# Patient Record
Sex: Female | Born: 1980 | Race: White | Hispanic: No | Marital: Married | Smoking: Never smoker
Health system: Southern US, Community
[De-identification: ages and names within clinical notes are randomized; demographics above are authoritative.]

## PROBLEM LIST (undated history)

## (undated) DIAGNOSIS — Z8619 Personal history of other infectious and parasitic diseases: Secondary | ICD-10-CM

## (undated) DIAGNOSIS — F32A Depression, unspecified: Secondary | ICD-10-CM

## (undated) DIAGNOSIS — N200 Calculus of kidney: Secondary | ICD-10-CM

## (undated) DIAGNOSIS — T7840XA Allergy, unspecified, initial encounter: Secondary | ICD-10-CM

## (undated) HISTORY — DX: Allergy, unspecified, initial encounter: T78.40XA

## (undated) HISTORY — DX: Personal history of other infectious and parasitic diseases: Z86.19

## (undated) HISTORY — PX: NO PAST SURGERIES: SHX2092

## (undated) HISTORY — DX: Depression, unspecified: F32.A

---

## 2011-03-31 ENCOUNTER — Other Ambulatory Visit: Payer: Self-pay | Admitting: Obstetrics and Gynecology

## 2011-03-31 DIAGNOSIS — N6009 Solitary cyst of unspecified breast: Secondary | ICD-10-CM

## 2011-04-04 ENCOUNTER — Ambulatory Visit
Admission: RE | Admit: 2011-04-04 | Discharge: 2011-04-04 | Disposition: A | Discharge status: Home / Self Care | Payer: 59 | Source: Ambulatory Visit | Attending: Obstetrics and Gynecology | Admitting: Obstetrics and Gynecology

## 2011-04-04 DIAGNOSIS — N6009 Solitary cyst of unspecified breast: Secondary | ICD-10-CM

## 2012-03-27 ENCOUNTER — Emergency Department (HOSPITAL_COMMUNITY)
Admission: EM | Admit: 2012-03-27 | Discharge: 2012-03-27 | Disposition: A | Discharge status: Home / Self Care | Payer: 59 | Attending: Emergency Medicine | Admitting: Emergency Medicine

## 2012-03-27 ENCOUNTER — Encounter (HOSPITAL_COMMUNITY): Payer: Self-pay | Admitting: Emergency Medicine

## 2012-03-27 DIAGNOSIS — Y9289 Other specified places as the place of occurrence of the external cause: Secondary | ICD-10-CM | POA: Insufficient documentation

## 2012-03-27 DIAGNOSIS — W230XXA Caught, crushed, jammed, or pinched between moving objects, initial encounter: Secondary | ICD-10-CM | POA: Insufficient documentation

## 2012-03-27 DIAGNOSIS — Z87442 Personal history of urinary calculi: Secondary | ICD-10-CM | POA: Insufficient documentation

## 2012-03-27 DIAGNOSIS — S92501A Displaced unspecified fracture of right lesser toe(s), initial encounter for closed fracture: Secondary | ICD-10-CM

## 2012-03-27 DIAGNOSIS — S92919A Unspecified fracture of unspecified toe(s), initial encounter for closed fracture: Secondary | ICD-10-CM | POA: Insufficient documentation

## 2012-03-27 DIAGNOSIS — Y939 Activity, unspecified: Secondary | ICD-10-CM | POA: Insufficient documentation

## 2012-03-27 HISTORY — DX: Calculus of kidney: N20.0

## 2012-03-27 MED ORDER — IBUPROFEN 400 MG PO TABS
400.0000 mg | ORAL_TABLET | Freq: Once | ORAL | Status: AC
  Administered 2012-03-27: 400 mg via ORAL
  Filled 2012-03-27: qty 1

## 2012-03-27 NOTE — Progress Notes (Signed)
Orthopedic Tech Progress Note Patient Details:  Colleen Gillespie 1980-11-27 [id number]  Ortho Devices Type of Ortho Device: Buddy tape;Postop shoe/boot Ortho Device/Splint Location: (R) LE Ortho Device/Splint Interventions: Application;Ordered   [name] 03/27/2012, 4:34 PM

## 2012-03-27 NOTE — ED Provider Notes (Signed)
History     CSN: [id number]  Arrival date & time 03/27/12  1458   First MD Initiated Contact with Patient 03/27/12 1548      Chief Complaint  Patient presents with  . Toe Injury    (Consider location/radiation/quality/duration/timing/severity/associated sxs/prior treatment) HPI Patient injured his right fifth on a door jam at 2:30 PM today complains of pain at right fifth toe no other injury. No treatment prior to coming here pain is worse with walking or pressure. Pain is mild at present, nonradiating Past Medical History  Diagnosis Date  . Kidney stones     History reviewed. No pertinent past surgical history.  History reviewed. No pertinent family history.  History  Substance Use Topics  . Smoking status: Never Smoker   . Smokeless tobacco: Not on file  . Alcohol Use: Yes     Comment: socially    OB History   Grav Para Term Preterm Abortions TAB SAB Ect Mult Living                  Review of Systems  Constitutional: Negative.   Musculoskeletal: Positive for arthralgias.       Pain at right fifth toe    Allergies  Aspirin  Home Medications   Current Outpatient Rx  Name  Route  Sig  Dispense  Refill  . CALCIUM-VITAMIN D PO   Oral   Take 1 tablet by mouth daily.         . Multiple Vitamin (MULTIVITAMIN WITH MINERALS) TABS   Oral   Take 1 tablet by mouth daily.           BP 103/48  Pulse 65  Temp(Src) 97.6 F (36.4 C) (Oral)  SpO2 100%  LMP 03/22/2012  Physical Exam  Nursing note and vitals reviewed. Constitutional: She appears well-developed and well-nourished. No distress.  HENT:  Head: Normocephalic and atraumatic.  Eyes: EOM are normal.  Neck: Neck supple.  Cardiovascular: Normal rate.   Pulmonary/Chest: No respiratory distress.  Abdominal: She exhibits no distension.  Musculoskeletal:  Right foot fifth toe with slight valgus deformity. Skin intact, minimally tender. No subungual hematoma.    ED Course  Procedures (including  critical care time)  Labs Reviewed - No data to display No results found.   No diagnosis found.  Placed in postop shoe and toes buddy taped. Patient comfortable on discharge  MDM  I discussed with patient that she clinically has a broken toe. X-ray not needed. Plan Buddy tape. Postop shoe. Referral to [facility] as needed. Ibuprofen for pain Patient reports that she's taken ibuprofen in the past without problems. Declined other pain medicine.  Diagnosis closed fracture right fifth toe      Doug Sou, MD 03/27/12 [phone]

## 2012-03-27 NOTE — ED Notes (Signed)
Per patient she "jammed" her "pinky toe" on the right foot into the bathroom door. No bleeding noted. Toe is deformed. No other complaints at this time.

## 2012-03-27 NOTE — ED Notes (Signed)
Ortho called for post op boot. 

## 2014-07-24 ENCOUNTER — Other Ambulatory Visit (HOSPITAL_COMMUNITY): Payer: Self-pay | Admitting: Obstetrics and Gynecology

## 2014-07-24 DIAGNOSIS — Z3141 Encounter for fertility testing: Secondary | ICD-10-CM

## 2014-07-31 ENCOUNTER — Ambulatory Visit (HOSPITAL_COMMUNITY)
Admission: RE | Admit: 2014-07-31 | Discharge: 2014-07-31 | Disposition: A | Discharge status: Home / Self Care | Payer: PRIVATE HEALTH INSURANCE | Source: Ambulatory Visit | Attending: Obstetrics and Gynecology | Admitting: Obstetrics and Gynecology

## 2014-07-31 DIAGNOSIS — N979 Female infertility, unspecified: Secondary | ICD-10-CM | POA: Diagnosis not present

## 2014-07-31 DIAGNOSIS — Z3141 Encounter for fertility testing: Secondary | ICD-10-CM

## 2014-07-31 MED ORDER — IOHEXOL 300 MG/ML  SOLN
30.0000 mL | Freq: Once | INTRAMUSCULAR | Status: AC | PRN
  Administered 2014-07-31: 30 mL

## 2014-09-22 LAB — OB RESULTS CONSOLE HEPATITIS B SURFACE ANTIGEN: HEP B S AG: NEGATIVE

## 2014-09-22 LAB — OB RESULTS CONSOLE GC/CHLAMYDIA
CHLAMYDIA, DNA PROBE: NEGATIVE
Gonorrhea: NEGATIVE

## 2014-09-22 LAB — OB RESULTS CONSOLE RPR: RPR: NONREACTIVE

## 2014-09-22 LAB — OB RESULTS CONSOLE RUBELLA ANTIBODY, IGM: RUBELLA: NON-IMMUNE/NOT IMMUNE

## 2014-09-22 LAB — OB RESULTS CONSOLE ABO/RH: RH TYPE: POSITIVE

## 2014-09-22 LAB — OB RESULTS CONSOLE HIV ANTIBODY (ROUTINE TESTING): HIV: NONREACTIVE

## 2014-09-22 LAB — OB RESULTS CONSOLE ANTIBODY SCREEN: ANTIBODY SCREEN: NEGATIVE

## 2014-11-08 ENCOUNTER — Inpatient Hospital Stay (HOSPITAL_COMMUNITY)
Admission: AD | Admit: 2014-11-08 | Payer: PRIVATE HEALTH INSURANCE | Source: Ambulatory Visit | Admitting: Obstetrics and Gynecology

## 2015-01-07 NOTE — L&D Delivery Note (Signed)
Delivery Note At 8:09 AM a viable female was delivered by NSVD Direct OA Position Apgars 9 9    Placenta status:spontaneously with 3 vessel cord , .  Cord:  with the following complications:none .  Cord pH: not obtained  Anesthesia: Epidural  Episiotomy: None Lacerations: 3rd degree sphincter reapproximated in standard fashion Suture Repair: 2.0 3.0 chromic Est. Blood Loss (mL): 500  Mom to postpartum.  Baby to Couplet care / Skin to Skin.  Edem Tiegs L 04/26/2015, 8:54 AM

## 2015-04-18 ENCOUNTER — Encounter (HOSPITAL_COMMUNITY): Payer: Self-pay | Admitting: *Deleted

## 2015-04-18 ENCOUNTER — Inpatient Hospital Stay (HOSPITAL_COMMUNITY)
Admission: AD | Admit: 2015-04-18 | Discharge: 2015-04-18 | Disposition: A | Discharge status: Home / Self Care | Payer: PRIVATE HEALTH INSURANCE | Source: Ambulatory Visit | Attending: Obstetrics & Gynecology | Admitting: Obstetrics & Gynecology

## 2015-04-18 DIAGNOSIS — Z3493 Encounter for supervision of normal pregnancy, unspecified, third trimester: Secondary | ICD-10-CM | POA: Insufficient documentation

## 2015-04-18 NOTE — Discharge Instructions (Signed)
Please call office with any further concerns/questions.  Keep follow-up prenatal appointments and return to MAU as needed.

## 2015-04-18 NOTE — MAU Note (Signed)
Had a little bit of bright red bleeding with mucous.  Not as bright now. Low back started hurting on the way . Was checked yesterday 3/90. No problems with preg. Denies any placental issues

## 2015-04-25 ENCOUNTER — Encounter (HOSPITAL_COMMUNITY): Payer: Self-pay | Admitting: *Deleted

## 2015-04-25 ENCOUNTER — Telehealth (HOSPITAL_COMMUNITY): Payer: Self-pay | Admitting: *Deleted

## 2015-04-25 LAB — OB RESULTS CONSOLE GBS: GBS: NEGATIVE

## 2015-04-25 NOTE — Telephone Encounter (Signed)
Preadmission screen  

## 2015-04-26 ENCOUNTER — Inpatient Hospital Stay (HOSPITAL_COMMUNITY)
Admission: AD | Admit: 2015-04-26 | Discharge: 2015-04-28 | DRG: 775 | Disposition: A | Discharge status: Home / Self Care | Payer: PRIVATE HEALTH INSURANCE | Source: Ambulatory Visit | Attending: Obstetrics and Gynecology | Admitting: Obstetrics and Gynecology

## 2015-04-26 ENCOUNTER — Encounter (HOSPITAL_COMMUNITY): Payer: Self-pay | Admitting: *Deleted

## 2015-04-26 ENCOUNTER — Inpatient Hospital Stay (HOSPITAL_COMMUNITY): Payer: PRIVATE HEALTH INSURANCE | Admitting: Anesthesiology

## 2015-04-26 DIAGNOSIS — Z833 Family history of diabetes mellitus: Secondary | ICD-10-CM

## 2015-04-26 DIAGNOSIS — Z8249 Family history of ischemic heart disease and other diseases of the circulatory system: Secondary | ICD-10-CM | POA: Diagnosis not present

## 2015-04-26 DIAGNOSIS — Z87442 Personal history of urinary calculi: Secondary | ICD-10-CM

## 2015-04-26 DIAGNOSIS — Z818 Family history of other mental and behavioral disorders: Secondary | ICD-10-CM

## 2015-04-26 DIAGNOSIS — IMO0001 Reserved for inherently not codable concepts without codable children: Secondary | ICD-10-CM

## 2015-04-26 LAB — CBC
HEMATOCRIT: 39.6 % (ref 36.0–46.0)
HEMOGLOBIN: 13.7 g/dL (ref 12.0–15.0)
MCH: 33.6 pg (ref 26.0–34.0)
MCHC: 34.6 g/dL (ref 30.0–36.0)
MCV: 97.1 fL (ref 78.0–100.0)
Platelets: 131 10*3/uL — ABNORMAL LOW (ref 150–400)
RBC: 4.08 MIL/uL (ref 3.87–5.11)
RDW: 12.9 % (ref 11.5–15.5)
WBC: 10.1 10*3/uL (ref 4.0–10.5)

## 2015-04-26 LAB — RPR: RPR Ser Ql: NONREACTIVE

## 2015-04-26 LAB — ABO/RH: ABO/RH(D): A POS

## 2015-04-26 LAB — TYPE AND SCREEN
ABO/RH(D): A POS
ANTIBODY SCREEN: NEGATIVE

## 2015-04-26 MED ORDER — LACTATED RINGERS IV BOLUS (SEPSIS)
500.0000 mL | Freq: Once | INTRAVENOUS | Status: AC
  Administered 2015-04-26: 500 mL via INTRAVENOUS

## 2015-04-26 MED ORDER — WITCH HAZEL-GLYCERIN EX PADS: 1.0000 "application " | MEDICATED_PAD | CUTANEOUS | Status: DC | PRN

## 2015-04-26 MED ORDER — OXYCODONE-ACETAMINOPHEN 5-325 MG PO TABS: 1.0000 | ORAL_TABLET | ORAL | Status: DC | PRN

## 2015-04-26 MED ORDER — SIMETHICONE 80 MG PO CHEW: 80.0000 mg | CHEWABLE_TABLET | ORAL | Status: DC | PRN

## 2015-04-26 MED ORDER — CITRIC ACID-SODIUM CITRATE 334-500 MG/5ML PO SOLN: 30.0000 mL | ORAL | Status: DC | PRN

## 2015-04-26 MED ORDER — TETANUS-DIPHTH-ACELL PERTUSSIS 5-2.5-18.5 LF-MCG/0.5 IM SUSP: 0.5000 mL | Freq: Once | INTRAMUSCULAR | Status: DC

## 2015-04-26 MED ORDER — OXYTOCIN BOLUS FROM INFUSION
500.0000 mL | INTRAVENOUS | Status: DC
  Administered 2015-04-26: 500 mL via INTRAVENOUS

## 2015-04-26 MED ORDER — LIDOCAINE HCL (PF) 1 % IJ SOLN
30.0000 mL | INTRAMUSCULAR | Status: DC | PRN
  Filled 2015-04-26: qty 30

## 2015-04-26 MED ORDER — MEDROXYPROGESTERONE ACETATE 150 MG/ML IM SUSP: 150.0000 mg | INTRAMUSCULAR | Status: DC | PRN

## 2015-04-26 MED ORDER — PHENYLEPHRINE 40 MCG/ML (10ML) SYRINGE FOR IV PUSH (FOR BLOOD PRESSURE SUPPORT)
80.0000 ug | PREFILLED_SYRINGE | INTRAVENOUS | Status: DC | PRN
Start: 2015-04-26 — End: 2015-04-26
  Filled 2015-04-26: qty 20
  Filled 2015-04-26: qty 2

## 2015-04-26 MED ORDER — LACTATED RINGERS IV SOLN: 500.0000 mL | INTRAVENOUS | Status: DC | PRN

## 2015-04-26 MED ORDER — DIPHENHYDRAMINE HCL 25 MG PO CAPS: 25.0000 mg | ORAL_CAPSULE | Freq: Four times a day (QID) | ORAL | Status: DC | PRN

## 2015-04-26 MED ORDER — ZOLPIDEM TARTRATE 5 MG PO TABS: 5.0000 mg | ORAL_TABLET | Freq: Every evening | ORAL | Status: DC | PRN

## 2015-04-26 MED ORDER — SENNOSIDES-DOCUSATE SODIUM 8.6-50 MG PO TABS
2.0000 | ORAL_TABLET | ORAL | Status: DC
  Filled 2015-04-26: qty 2

## 2015-04-26 MED ORDER — BENZOCAINE-MENTHOL 20-0.5 % EX AERO
1.0000 "application " | INHALATION_SPRAY | CUTANEOUS | Status: DC | PRN
  Filled 2015-04-26: qty 56

## 2015-04-26 MED ORDER — ONDANSETRON HCL 4 MG/2ML IJ SOLN: 4.0000 mg | INTRAMUSCULAR | Status: DC | PRN

## 2015-04-26 MED ORDER — LACTATED RINGERS IV SOLN: 500.0000 mL | Freq: Once | INTRAVENOUS | Status: DC

## 2015-04-26 MED ORDER — OXYTOCIN 10 UNIT/ML IJ SOLN
2.5000 [IU]/h | INTRAMUSCULAR | Status: DC
  Filled 2015-04-26: qty 4

## 2015-04-26 MED ORDER — BISACODYL 10 MG RE SUPP: 10.0000 mg | Freq: Every day | RECTAL | Status: DC | PRN

## 2015-04-26 MED ORDER — FLEET ENEMA 7-19 GM/118ML RE ENEM: 1.0000 | ENEMA | Freq: Every day | RECTAL | Status: DC | PRN

## 2015-04-26 MED ORDER — ACETAMINOPHEN 325 MG PO TABS
650.0000 mg | ORAL_TABLET | ORAL | Status: DC | PRN
  Administered 2015-04-27 (×2): 650 mg via ORAL
  Filled 2015-04-26 (×5): qty 2

## 2015-04-26 MED ORDER — IBUPROFEN 600 MG PO TABS: 600.0000 mg | ORAL_TABLET | Freq: Four times a day (QID) | ORAL | Status: DC

## 2015-04-26 MED ORDER — EPHEDRINE 5 MG/ML INJ
10.0000 mg | INTRAVENOUS | Status: DC | PRN
  Filled 2015-04-26: qty 2

## 2015-04-26 MED ORDER — DIPHENHYDRAMINE HCL 50 MG/ML IJ SOLN: 12.5000 mg | INTRAMUSCULAR | Status: DC | PRN

## 2015-04-26 MED ORDER — FLEET ENEMA 7-19 GM/118ML RE ENEM: 1.0000 | ENEMA | RECTAL | Status: DC | PRN

## 2015-04-26 MED ORDER — ONDANSETRON HCL 4 MG PO TABS: 4.0000 mg | ORAL_TABLET | ORAL | Status: DC | PRN

## 2015-04-26 MED ORDER — COCONUT OIL OIL: 1.0000 "application " | TOPICAL_OIL | Status: DC | PRN

## 2015-04-26 MED ORDER — LIDOCAINE HCL (PF) 1 % IJ SOLN
INTRAMUSCULAR | Status: DC | PRN
  Administered 2015-04-26: 2 mL via EPIDURAL
  Administered 2015-04-26 (×2): 4 mL via EPIDURAL

## 2015-04-26 MED ORDER — EPHEDRINE 5 MG/ML INJ
10.0000 mg | INTRAVENOUS | Status: DC | PRN
Start: 2015-04-26 — End: 2015-04-26
  Filled 2015-04-26: qty 2

## 2015-04-26 MED ORDER — ACETAMINOPHEN 325 MG PO TABS: 650.0000 mg | ORAL_TABLET | ORAL | Status: DC | PRN

## 2015-04-26 MED ORDER — PHENYLEPHRINE 40 MCG/ML (10ML) SYRINGE FOR IV PUSH (FOR BLOOD PRESSURE SUPPORT)
80.0000 ug | PREFILLED_SYRINGE | INTRAVENOUS | Status: DC | PRN
Start: 2015-04-26 — End: 2015-04-26
  Administered 2015-04-26: 80 ug via INTRAVENOUS
  Filled 2015-04-26: qty 2

## 2015-04-26 MED ORDER — OXYCODONE-ACETAMINOPHEN 5-325 MG PO TABS: 2.0000 | ORAL_TABLET | ORAL | Status: DC | PRN

## 2015-04-26 MED ORDER — MEASLES, MUMPS & RUBELLA VAC ~~LOC~~ INJ
0.5000 mL | INJECTION | Freq: Once | SUBCUTANEOUS | Status: DC
  Filled 2015-04-26: qty 0.5

## 2015-04-26 MED ORDER — LACTATED RINGERS IV SOLN
INTRAVENOUS | Status: DC
  Administered 2015-04-26 (×2): via INTRAVENOUS

## 2015-04-26 MED ORDER — BUPIVACAINE HCL (PF) 0.25 % IJ SOLN
INTRAMUSCULAR | Status: DC | PRN
  Administered 2015-04-26: 4 mL via EPIDURAL

## 2015-04-26 MED ORDER — PRENATAL MULTIVITAMIN CH
1.0000 | ORAL_TABLET | Freq: Every day | ORAL | Status: DC
  Administered 2015-04-26 – 2015-04-27 (×2): 1 via ORAL
  Filled 2015-04-26 (×2): qty 1

## 2015-04-26 MED ORDER — DIBUCAINE 1 % RE OINT
1.0000 | TOPICAL_OINTMENT | RECTAL | Status: DC | PRN
Start: 2015-04-26 — End: 2015-04-28

## 2015-04-26 MED ORDER — ONDANSETRON HCL 4 MG/2ML IJ SOLN: 4.0000 mg | Freq: Four times a day (QID) | INTRAMUSCULAR | Status: DC | PRN

## 2015-04-26 MED ORDER — FENTANYL 2.5 MCG/ML BUPIVACAINE 1/10 % EPIDURAL INFUSION (WH - ANES)
14.0000 mL/h | INTRAMUSCULAR | Status: DC | PRN
  Administered 2015-04-26: 12 mL/h via EPIDURAL
  Filled 2015-04-26: qty 125

## 2015-04-26 NOTE — Lactation Note (Signed)
This note was copied from a baby's chart. Lactation Consultation Note  Patient Name: Colleen Maren BeachSara Bucknam ZOXWR'UToday's Date: 04/26/2015 Reason for consult: Initial assessment   Initial consult at 8 hrs old; GA 39.5; BW 8 lbs, 9.7 oz.  Mom is a P1.  EBL 500 ml; vaginal delivery. Infant has breastfed x3 (15-30 min) + attempt x1 (0 min); voids-0; stools-3 since birth 8 hrs ago.  LS-9 by RNs Taught mom how to hand express with return demonstration and observation of drops of colostrum easily expressed.   Mom wanted LC to watch latch; mom attempted latching in cradle hold with shallow latch.   [facility] to latch using cross-cradle hold with sandwiching of breast for deeper latch.  Infant easily latched with flanged lips and rhythmical sucking.  Taught mom how to flange bottom lip if needed.  LS-8. Educated on size of infant's stomach, cluster feeding, and continuing to feed with feeding cues. Lactation brochure given and informed of hospital support group and IP and OP services. Encouraged to call for assistance as needed with latching.      Maternal Data Has patient been taught Hand Expression?: Yes Does the patient have breastfeeding experience prior to this delivery?: No  Feeding Feeding Type: Breast Fed Length of feed: 0 min  LATCH Score/Interventions Latch: Grasps breast easily, tongue down, lips flanged, rhythmical sucking.  Audible Swallowing: A few with stimulation  Type of Nipple: Everted at rest and after stimulation  Comfort (Breast/Nipple): Soft / non-tender     Hold (Positioning): Assistance needed to correctly position infant at breast and maintain latch. Intervention(s): Breastfeeding basics reviewed;Support Pillows;Skin to skin  LATCH Score: 8  Lactation Tools Discussed/Used WIC Program: No   Consult Status Consult Status: Follow-up Date: 04/27/15 Follow-up type: In-patient    Lendon KaVann, Kataleya Zaugg Walker 04/26/2015, 4:56 PM

## 2015-04-26 NOTE — Progress Notes (Signed)
Pt called RN stating she suddenly started feeling very weak and lightheaded.  BP 91/47; HR 68; Temp 98.8 F.  Fundus firm at 1 below the umbilicus with a small amount of bleeding. Dr. Vincente PoliGrewal notified and ordered a 500cc LR bolus.  Will continue to monitor.

## 2015-04-26 NOTE — Anesthesia Preprocedure Evaluation (Signed)
Anesthesia Evaluation  Patient identified by MRN, date of birth, ID band Patient awake    Reviewed: Allergy & Precautions, H&P , NPO status , Patient's Chart, lab work & pertinent test results  History of Anesthesia Complications Negative for: history of anesthetic complications  Airway Mallampati: II  TM Distance: >3 FB Neck ROM: full    Dental no notable dental hx.    Pulmonary neg pulmonary ROS,    Pulmonary exam normal breath sounds clear to auscultation       Cardiovascular negative cardio ROS Normal cardiovascular exam Rhythm:regular Rate:Normal     Neuro/Psych negative neurological ROS     GI/Hepatic negative GI ROS, Neg liver ROS,   Endo/Other  negative endocrine ROS  Renal/GU negative Renal ROS     Musculoskeletal   Abdominal   Peds  Hematology negative hematology ROS (+)   Anesthesia Other Findings Pregnancy - uncomplicated Platelets and allergies reviewed Denies active cardiac or pulmonary symptoms, METS > 4  Denies blood thinning medications, bleeding disorders, hypertension, asthma, supine hypotension syndrome, previous anesthesia difficulties    Reproductive/Obstetrics negative OB ROS                             Anesthesia Physical Anesthesia Plan  ASA: II  Anesthesia Plan: Epidural   Post-op Pain Management:    Induction:   Airway Management Planned:   Additional Equipment:   Intra-op Plan:   Post-operative Plan:   Informed Consent: I have reviewed the patients History and Physical, chart, labs and discussed the procedure including the risks, benefits and alternatives for the proposed anesthesia with the patient or authorized representative who has indicated his/her understanding and acceptance.   Dental Advisory Given  Plan Discussed with: Anesthesiologist, CRNA and Surgeon  Anesthesia Plan Comments:         Anesthesia Quick Evaluation

## 2015-04-26 NOTE — H&P (Signed)
Colleen BeachSara Gillespie is a 35 y.o. female G1 @ 39+5 wks presenting for SOL. History OB History    Gravida Para Term Preterm AB TAB SAB Ectopic Multiple Living   1              Past Medical History  Diagnosis Date  . Kidney stones   . Hx of varicella    Past Surgical History  Procedure Laterality Date  . No past surgeries     Family History: family history includes Anxiety disorder in her mother; Cancer in her father; Diabetes in her maternal grandfather; Heart disease in her maternal grandmother; Hypertension in her father. Social History:  reports that she has never smoked. She has never used smokeless tobacco. She reports that she does not drink alcohol or use illicit drugs.   Prenatal Transfer Tool  Maternal Diabetes: No Genetic Screening: Normal Maternal Ultrasounds/Referrals: Normal Fetal Ultrasounds or other Referrals:  None Maternal Substance Abuse:  No Significant Maternal Medications:  None Significant Maternal Lab Results:  None Other Comments:  None  ROS  Dilation: 10 Effacement (%): 90 Station: +1, +2 Exam by:: Genelle BalE. Johnson, RN  Blood pressure 95/48, pulse 64, temperature 98.3 F (36.8 C), temperature source Axillary, resp. rate 16, height 5\' 5"  (1.651 m), weight 133 lb (60.328 kg), last menstrual period 07/31/2014, SpO2 100 %. Exam Physical Exam  Gen - comfortable now with epidural Abd - gravid, NT Ext - NT, no edema Cvx c/c/+2 AROM - clear Prenatal labs: ABO, Rh: --/--/A POS, A POS (04/20 0130) Antibody: NEG (04/20 0130) Rubella: Nonimmune (09/16 0000) RPR: Nonreactive (09/16 0000)  HBsAg: Negative (09/16 0000)  HIV: Non-reactive (09/16 0000)  GBS: Negative (04/19 0000)   Assessment/Plan: Admit Exp mngt Epidural prn  Colleen Gillespie 04/26/2015, 7:25 AM

## 2015-04-26 NOTE — Anesthesia Procedure Notes (Signed)
Epidural Patient location during procedure: OB Start time: 04/26/2015 2:13 AM End time: 04/26/2015 2:15 AM  Staffing Anesthesiologist: Kaloni Bisaillon Performed by: anesthesiologist   Preanesthetic Checklist Completed: patient identified, site marked, surgical consent, pre-op evaluation, timeout performed, IV checked, risks and benefits discussed and monitors and equipment checked  Epidural Patient position: sitting Prep: site prepped and draped and DuraPrep Patient monitoring: continuous pulse ox and blood pressure Approach: midline Location: L3-L4 Injection technique: LOR saline  Needle:  Needle type: Tuohy  Needle gauge: 17 G Needle length: 9 cm and 9 Needle insertion depth: 5 cm cm Catheter type: closed end flexible Catheter size: 19 Gauge Catheter at skin depth: 9 cm Test dose: negative  Assessment Events: blood not aspirated, injection not painful, no injection resistance, negative IV test and no paresthesia  Additional Notes Patient identified. Risks/Benefits/Options discussed with patient including but not limited to bleeding, infection, nerve damage, paralysis, failed block, incomplete pain control, headache, blood pressure changes, nausea, vomiting, reactions to medications, itching and postpartum back pain. Confirmed with bedside nurse the patient's most recent platelet count. Confirmed with patient that they are not currently taking any anticoagulation, have any bleeding history or any family history of bleeding disorders. Patient expressed understanding and wished to proceed. All questions were answered. Sterile technique was used throughout the entire procedure. Please see nursing notes for vital signs. Test dose was given through epidural catheter and negative prior to continuing to dose epidural or start infusion. Warning signs of high block given to the patient including shortness of breath, tingling/numbness in hands, complete motor block, or any concerning symptoms  with instructions to call for help. Patient was given instructions on fall risk and not to get out of bed. All questions and concerns addressed with instructions to call with any issues or inadequate analgesia.

## 2015-04-27 LAB — CBC
HCT: 28.6 % — ABNORMAL LOW (ref 36.0–46.0)
Hemoglobin: 9.8 g/dL — ABNORMAL LOW (ref 12.0–15.0)
MCH: 32.7 pg (ref 26.0–34.0)
MCHC: 34.3 g/dL (ref 30.0–36.0)
MCV: 95.3 fL (ref 78.0–100.0)
PLATELETS: 124 10*3/uL — AB (ref 150–400)
RBC: 3 MIL/uL — ABNORMAL LOW (ref 3.87–5.11)
RDW: 13.3 % (ref 11.5–15.5)
WBC: 12.1 10*3/uL — AB (ref 4.0–10.5)

## 2015-04-27 NOTE — Progress Notes (Signed)
Post Partum Day 1 Subjective: no complaints, up ad lib, voiding and tolerating PO  Objective: Blood pressure 82/46, pulse 64, temperature 98.8 F (37.1 C), temperature source Oral, resp. rate 18, height 5\' 5"  (1.651 m), weight 133 lb (60.328 kg), last menstrual period 07/31/2014, SpO2 100 %, unknown if currently breastfeeding.  Physical Exam:  General: alert and cooperative Lochia: appropriate Uterine Fundus: firm Incision: healing well, small labial and perineal edema DVT Evaluation: No evidence of DVT seen on physical exam. Negative Homan's sign. No cords or calf tenderness. No significant calf/ankle edema.   Recent Labs  04/26/15 0130 04/27/15 0518  HGB 13.7 9.8*  HCT 39.6 28.6*    Assessment/Plan: Plan for discharge tomorrow   LOS: 1 day   CURTIS,CAROL G 04/27/2015, 8:04 AM

## 2015-04-27 NOTE — Lactation Note (Signed)
This note was copied from a baby's chart. Lactation Consultation Note Mom has cracked nipples. Assisted her with positioning and she reported increased comfort.  Feeding lasted about 5 minutes and many swallows were heard.  Patient Name: Colleen Maren BeachSara Stines ZOXWR'UToday's Date: 04/27/2015 Reason for consult: Follow-up assessment   Maternal Data Has patient been taught Hand Expression?: Yes  Feeding Feeding Type: Breast Fed Length of feed: 5 min  LATCH Score/Interventions Latch: Grasps breast easily, tongue down, lips flanged, rhythmical sucking.  Audible Swallowing: Spontaneous and intermittent  Type of Nipple: Everted at rest and after stimulation  Comfort (Breast/Nipple): Soft / non-tender  Interventions (Mild/moderate discomfort): Comfort gels (comfort gels given and explained by Christella HartiganBeverly Daly, RN)  Hold (Positioning): Assistance needed to correctly position infant at breast and maintain latch.  LATCH Score: 9  Lactation Tools Discussed/Used     Consult Status Consult Status: Follow-up Date: 04/28/15 Follow-up type: In-patient    Soyla DryerJoseph, Sharen Youngren 04/27/2015, 4:54 PM

## 2015-04-28 NOTE — Progress Notes (Signed)
Refuses Rubella information given

## 2015-04-28 NOTE — Discharge Summary (Signed)
Obstetric Discharge Summary Reason for Admission: onset of labor Prenatal Procedures: none Intrapartum Procedures: spontaneous vaginal delivery Postpartum Procedures: none Complications-Operative and Postpartum: none HEMOGLOBIN  Date Value Ref Range Status  04/27/2015 9.8* 12.0 - 15.0 g/dL Final    Comment:    REPEATED TO VERIFY DELTA CHECK NOTED    HCT  Date Value Ref Range Status  04/27/2015 28.6* 36.0 - 46.0 % Final    Physical Exam:  General: alert, cooperative, appears stated age and no distress Lochia: appropriate Uterine Fundus: firm Incision: healing well DVT Evaluation: No evidence of DVT seen on physical exam.  Discharge Diagnoses: Term Pregnancy-delivered  Discharge Information: Date: 04/28/2015 Activity: pelvic rest Diet: routine Medications: None Condition: stable Instructions: refer to practice specific booklet Discharge to: home   Newborn Data: Live born female  Birth Weight: 8 lb 9.7 oz (3905 g) APGAR: 9, 9  Home with mother.  Colleen Gillespie 04/28/2015, 9:32 AM

## 2015-04-28 NOTE — Anesthesia Postprocedure Evaluation (Signed)
Anesthesia Post Note  Patient: Colleen Gillespie  Procedure(s) Performed: * No procedures listed *  Patient location during evaluation: Mother Baby Anesthesia Type: Epidural Level of consciousness: awake and alert Pain management: pain level controlled Vital Signs Assessment: post-procedure vital signs reviewed and stable Respiratory status: spontaneous breathing, nonlabored ventilation and respiratory function stable Cardiovascular status: stable Postop Assessment: no headache, no backache and epidural receding Anesthetic complications: no    Last Vitals: There were no vitals filed for this visit.  Last Pain: There were no vitals filed for this visit.               Cecile HearingStephen Edward Turk

## 2015-04-28 NOTE — Lactation Note (Signed)
This note was copied from a baby's chart. Lactation Consultation Note  Follow up feeding done prior to discharge.   Mom states feedings are going well and baby is latching easily.  Reviewed basics and discharge instructions including engorgement treatment.  Questions answered.  Lactation outpatient services and support information reviewed and encouraged.  Patient Name: Colleen Gillespie [id number]'XToday's Date: 04/28/2015     Maternal Data    Feeding    LATCH Score/Interventions                      Lactation Tools Discussed/Used     Consult Status      Huston FoleyMOULDEN, [name] 04/28/2015, 10:48 AM

## 2015-05-03 ENCOUNTER — Inpatient Hospital Stay (HOSPITAL_COMMUNITY): Admission: RE | Admit: 2015-05-03 | Payer: PRIVATE HEALTH INSURANCE | Source: Ambulatory Visit

## 2017-04-18 IMAGING — RF DG HYSTEROGRAM
6 series · 6 of 6 positions shown · IV contrast (omnipaque)
Comparison: None.

CLINICAL DATA: Primary infertility.

EXAM:
HYSTEROSALPINGOGRAM
TECHNIQUE: Following cleansing of the cervix and vagina with Betadine solution,
a hysterosalpingogram was performed using a 5-French
hysterosalpingogram catheter and Omnipaque 300 contrast. The patient
tolerated the examination without difficulty.

[Series 1: run · 1 of 1 slices shown (1 of 6)]
[im 1/1]
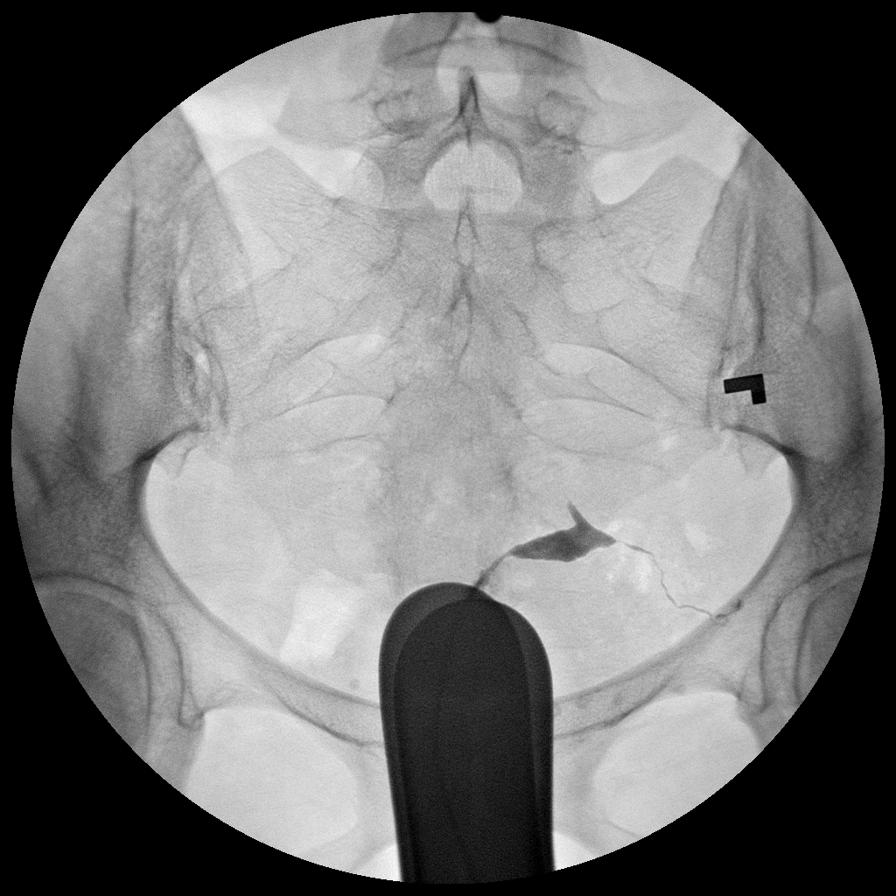

[Series 2: run · 1 of 1 slices shown (2 of 6)]
[im 1/1]
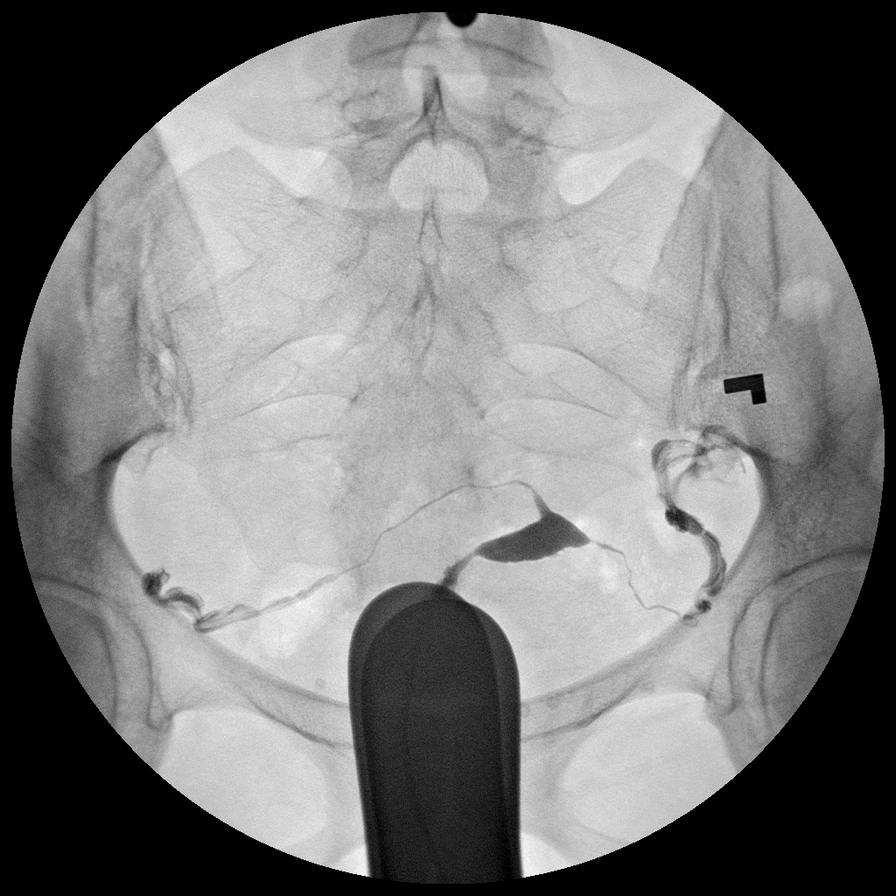

[Series 3: run · 1 of 1 slices shown (3 of 6)]
[im 1/1]
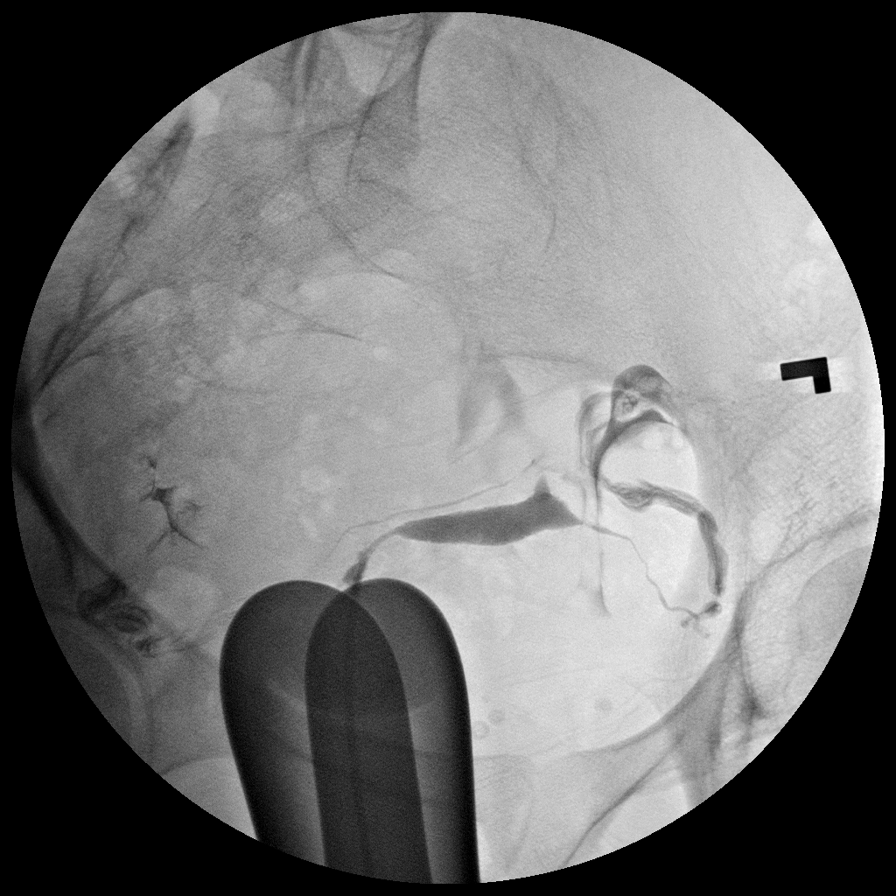

[Series 4: run · 1 of 1 slices shown (4 of 6)]
[im 1/1]
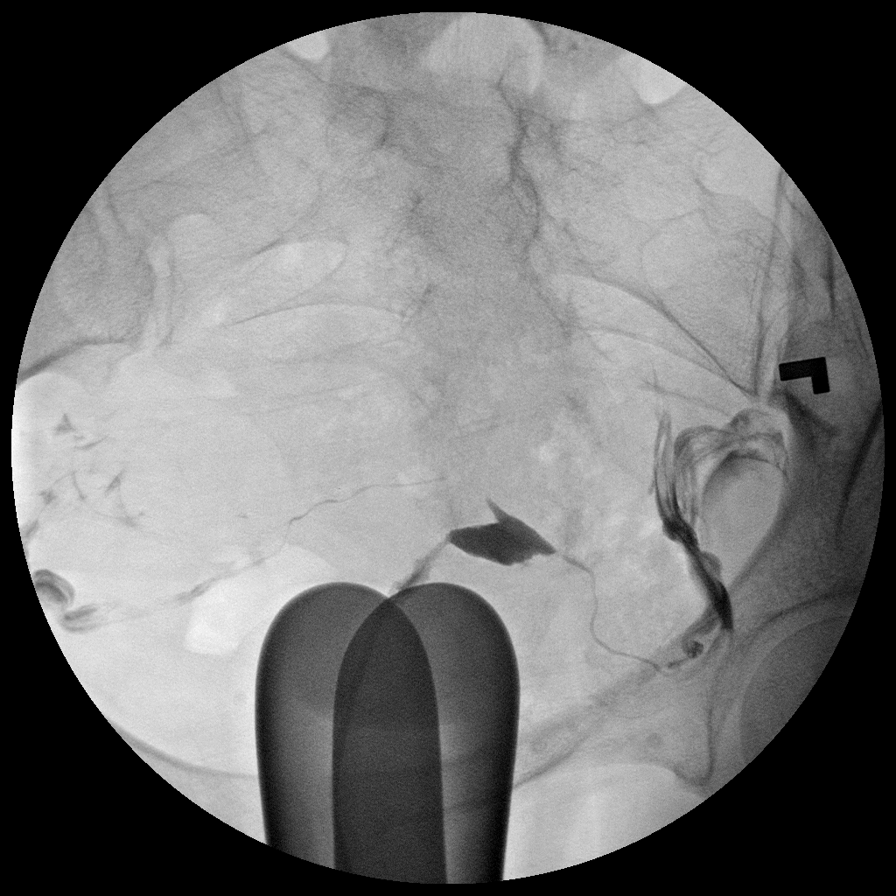

[Series 5: run · 1 of 1 slices shown (5 of 6)]
[im 1/1]
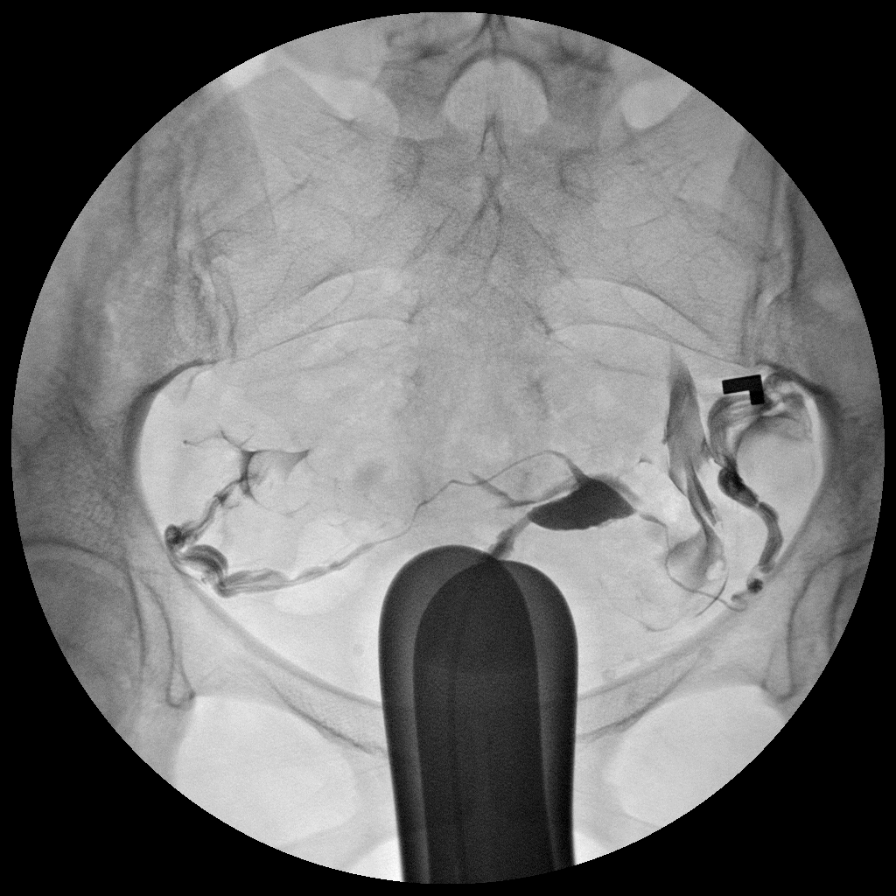

[Series 6: run · 1 of 1 slices shown (6 of 6)]
[im 1/1]
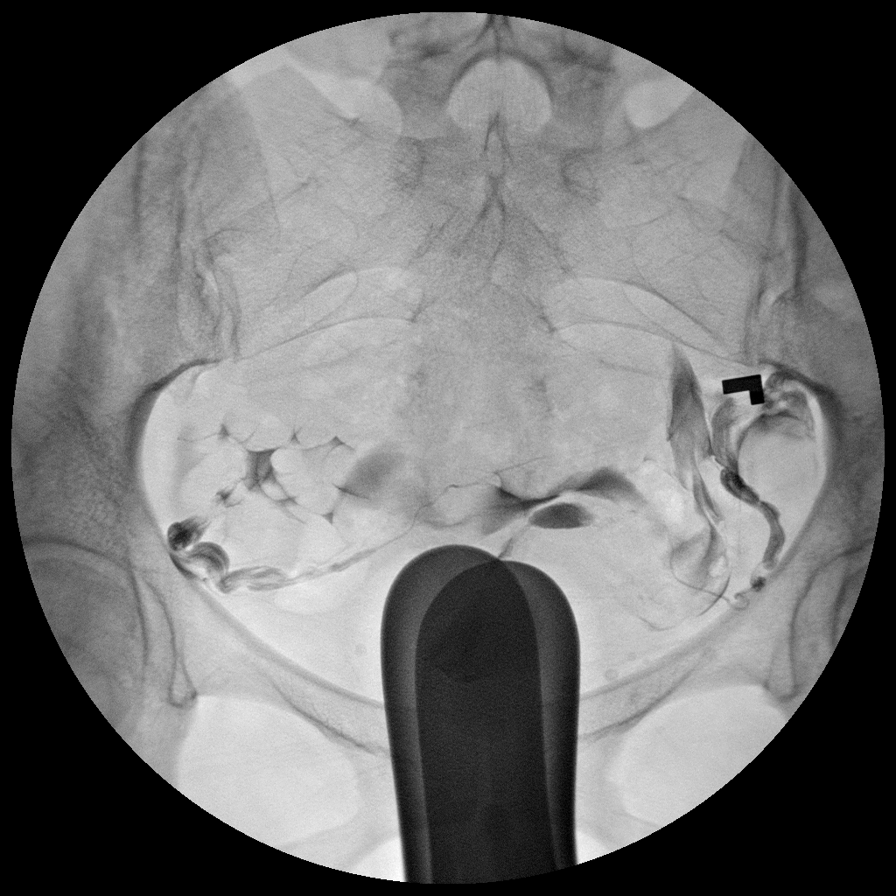

[6 of 6 positions shown; findings below may reference images not displayed]

FLUOROSCOPY TIME:  Fluoroscopy Time:  0 minutes 42 seconds

Number of Acquired Images:  6
FINDINGS: Endometrial Cavity: Normal appearance. No signs of Mullerian duct
anomaly or other significant abnormality.

Right Fallopian Tube: Well opacified and normal in appearance. Free
intraperitoneal spill of contrast is demonstrated.

Left Fallopian Tube: Well opacified and normal in appearance. Free
intraperitoneal spill of contrast is demonstrated.

Other:  None.
IMPRESSION: Normal study.  Both fallopian tubes are patent.

## 2019-01-30 ENCOUNTER — Encounter (HOSPITAL_COMMUNITY): Payer: Self-pay | Admitting: Emergency Medicine

## 2019-01-30 ENCOUNTER — Other Ambulatory Visit: Payer: Self-pay

## 2019-01-30 ENCOUNTER — Emergency Department (HOSPITAL_COMMUNITY)
Admission: EM | Admit: 2019-01-30 | Discharge: 2019-01-30 | Disposition: A | Discharge status: Home / Self Care | Payer: 59 | Attending: Emergency Medicine | Admitting: Emergency Medicine

## 2019-01-30 ENCOUNTER — Emergency Department (HOSPITAL_COMMUNITY): Payer: 59

## 2019-01-30 DIAGNOSIS — Z8709 Personal history of other diseases of the respiratory system: Secondary | ICD-10-CM | POA: Insufficient documentation

## 2019-01-30 DIAGNOSIS — Z79899 Other long term (current) drug therapy: Secondary | ICD-10-CM | POA: Insufficient documentation

## 2019-01-30 DIAGNOSIS — R0789 Other chest pain: Secondary | ICD-10-CM | POA: Diagnosis not present

## 2019-01-30 DIAGNOSIS — R0602 Shortness of breath: Secondary | ICD-10-CM

## 2019-01-30 DIAGNOSIS — R5383 Other fatigue: Secondary | ICD-10-CM | POA: Insufficient documentation

## 2019-01-30 LAB — CBC
HCT: 39.6 % (ref 36.0–46.0)
Hemoglobin: 13.2 g/dL (ref 12.0–15.0)
MCH: 33 pg (ref 26.0–34.0)
MCHC: 33.3 g/dL (ref 30.0–36.0)
MCV: 99 fL (ref 80.0–100.0)
Platelets: 163 10*3/uL (ref 150–400)
RBC: 4 MIL/uL (ref 3.87–5.11)
RDW: 11.2 % — ABNORMAL LOW (ref 11.5–15.5)
WBC: 3.5 10*3/uL — ABNORMAL LOW (ref 4.0–10.5)
nRBC: 0 % (ref 0.0–0.2)

## 2019-01-30 LAB — BASIC METABOLIC PANEL
Anion gap: 9 (ref 5–15)
BUN: 9 mg/dL (ref 6–20)
CO2: 22 mmol/L (ref 22–32)
Calcium: 9 mg/dL (ref 8.9–10.3)
Chloride: 107 mmol/L (ref 98–111)
Creatinine, Ser: 0.57 mg/dL (ref 0.44–1.00)
GFR calc Af Amer: >60 mL/min (ref >60)
GFR calc non Af Amer: >60 mL/min (ref >60)
Glucose, Bld: 93 mg/dL (ref 70–99)
Potassium: 3.6 mmol/L (ref 3.5–5.1)
Sodium: 138 mmol/L (ref 135–145)

## 2019-01-30 LAB — I-STAT BETA HCG BLOOD, ED (MC, WL, AP ONLY): I-stat hCG, quantitative: <5 m[IU]/mL (ref <5)

## 2019-01-30 LAB — TROPONIN I (HIGH SENSITIVITY): Troponin I (High Sensitivity): 3 ng/L (ref <18)

## 2019-01-30 NOTE — ED Notes (Signed)
Walked patient in room HR 46 With Sats @100  on Room air. No Shortness of breath noted.

## 2019-01-30 NOTE — ED Notes (Signed)
Patient verbalizes understanding of discharge instructions. Opportunity for questioning and answers were provided.  pt discharged from ED, ambulatory by self   

## 2019-01-30 NOTE — Discharge Instructions (Addendum)
You were seen in the emergency department for chest pain.  Your EKG is reassuring, your troponin is normal, your chest x-ray is reassuring as well.  Likely your symptoms are secondary to your recent Covid infection.  You are not having a heart attack.  Please return to the emergency department have any worsening shortness of breath or chest pain, follow-up with your PCP for any further symptoms.

## 2019-01-30 NOTE — ED Triage Notes (Signed)
Pt endorses generalized chest pain and SOB starting today. Endorses weakness. Covid + on 1/18

## 2019-01-30 NOTE — ED Provider Notes (Signed)
[facility] EMERGENCY DEPARTMENT Provider Note   CSN: [id number] Arrival date & time: 01/30/19  1831     History Chief Complaint  Patient presents with  . Covid/CP  . Shortness of Breath    Colleen Gillespie is a 39 y.o. female.  HPI 39 year old female previously healthy presenting to the emergency department for chest pain, in the setting of recent Covid infection.  Patient states that she had intermittent centralized chest discomfort earlier this evening, states that it was self-limited and transient and is currently asymptomatic.  States that she was concerned, also reports generalized fatigue but otherwise states that she has had no fevers or chills, no hemoptysis or hematemesis, states she has a dry cough that is lingering, but denies any leg swelling, leg redness or pain, nausea or vomiting or abdominal pain, states that the pain in her chest did not radiate, nonexertional, no diaphoresis, no history of heart disease.  Patient states that she feels much better than when she first was diagnosed with Covid approximately 7 days ago, otherwise states that she feels fine.  No diarrhea or hematuria, no dysuria or vaginal symptoms.    Past Medical History:  Diagnosis Date  . Hx of varicella   . Kidney stones     Patient Active Problem List   Diagnosis Date Noted  . Active labor at term 04/26/2015  . NSVD (normal spontaneous vaginal delivery) 04/26/2015    Past Surgical History:  Procedure Laterality Date  . NO PAST SURGERIES       OB History    Gravida  1   Para  1   Term  1   Preterm      AB      Living  1     SAB      TAB      Ectopic      Multiple  0   Live Births  1           Family History  Problem Relation Age of Onset  . Anxiety disorder Mother   . Hypertension Father   . Cancer Father        skin  . Heart disease Maternal Grandmother   . Diabetes Maternal Grandfather     Social History   Tobacco Use  . Smoking  status: Never Smoker  . Smokeless tobacco: Never Used  Substance Use Topics  . Alcohol use: No    Comment: socially  . Drug use: No    Home Medications Prior to Admission medications   Medication Sig Start Date End Date Taking? Authorizing Provider  calcium carbonate (TUMS - DOSED IN MG ELEMENTAL CALCIUM) 500 MG chewable tablet Chew 1 tablet by mouth 2 (two) times daily as needed for indigestion or heartburn.    [provider]  CALCIUM-VITAMIN D PO Take 1 tablet by mouth daily.    [provider]  Prenatal Vit-Fe Fumarate-FA (PRENATAL MULTIVITAMIN) TABS tablet Take 1 tablet by mouth daily at 12 noon.    [provider]    Allergies    Aspirin  Review of Systems   Review of Systems  Constitutional: Negative for chills and fever.  HENT: Negative for ear pain and sore throat.   Eyes: Negative for pain and visual disturbance.  Respiratory: Negative for cough and shortness of breath.   Cardiovascular: Positive for chest pain. Negative for palpitations.  Gastrointestinal: Negative for abdominal pain and vomiting.  Genitourinary: Negative for dysuria and hematuria.  Musculoskeletal: Negative for arthralgias  and back pain.  Skin: Negative for color change and rash.  Neurological: Negative for seizures and syncope.  All other systems reviewed and are negative.   Physical Exam Updated Vital Signs BP 134/69   Pulse (!) 57   Temp 98.9 F (37.2 C) (Oral)   Resp 20   Ht 5\' 5"  (1.651 m)   Wt 52.2 kg   LMP  (LMP Unknown)   SpO2 100%   BMI 19.14 kg/m   Physical Exam Vitals and nursing note reviewed.  Constitutional:      General: She is not in acute distress.    Appearance: She is well-developed. She is not ill-appearing, toxic-appearing or diaphoretic.  HENT:     Head: Normocephalic and atraumatic.     Mouth/Throat:     Mouth: Mucous membranes are moist.     Pharynx: Oropharynx is clear.  Eyes:     Conjunctiva/sclera: Conjunctivae normal.      Pupils: Pupils are equal, round, and reactive to light.  Cardiovascular:     Rate and Rhythm: Regular rhythm. Bradycardia present.     Heart sounds: No murmur.  Pulmonary:     Effort: Pulmonary effort is normal. No respiratory distress.     Breath sounds: Normal breath sounds. No decreased breath sounds, wheezing, rhonchi or rales.  Chest:     Chest wall: No mass, deformity, tenderness or crepitus.  Abdominal:     Palpations: Abdomen is soft.     Tenderness: There is no abdominal tenderness.  Musculoskeletal:     Cervical back: Normal range of motion and neck supple.  Skin:    General: Skin is warm and dry.  Neurological:     General: No focal deficit present.     Mental Status: She is alert.  Psychiatric:        Mood and Affect: Mood normal.     ED Results / Procedures / Treatments   Labs (all labs ordered are listed, but only abnormal results are displayed) Labs Reviewed  CBC - Abnormal; Notable for the following components:      Result Value   WBC 3.5 (*)    RDW 11.2 (*)    All other components within normal limits  BASIC METABOLIC PANEL  I-STAT BETA HCG BLOOD, ED ([facility], [facility], [facility] ONLY)  TROPONIN I (HIGH SENSITIVITY)    EKG None  Radiology DG Chest Portable 1 View  Result Date: 01/30/2019 CLINICAL DATA:  COVID. Additional history provided: Generalized chest pain and shortness of breath, weakness, COVID positive on 01/18 EXAM: PORTABLE CHEST 1 VIEW COMPARISON:  No pertinent prior studies available for comparison. FINDINGS: Heart size within normal limits. There is no evidence of airspace consolidation within the lungs. No evidence of pleural effusion or pneumothorax. No acute bony abnormality. IMPRESSION: No evidence of acute cardiopulmonary abnormality. Electronically Signed   By: 2/18 DO   On: 01/30/2019 19:32    Procedures Procedures (including critical care time)  Medications Ordered in ED Medications - No data to display  ED Course  I have reviewed the  triage vital signs and the nursing notes.  Pertinent labs & imaging results that were available during my care of the patient were reviewed by me and considered in my medical decision making (see chart for details).    MDM Rules/Calculators/A&P                      39 year old female presenting to the ED for chest pain in setting of recent  Covid infection.  Patient afebrile and well-appearing, hemodynamically stable, slightly bradycardic in the 50s for heart rate, patient is asymptomatic, no palpitations, no chest pain, no shortness of breath, denies any lightheadedness or any other symptoms currently.  Patient states she runs marathons, that her resting heart rate is usually in the low 50s.  She otherwise appears well, athletic and well-appearing.  Patient ambulated well with no assistance, no hypoxia or dyspnea noted, no tachypnea noted, no tachycardia noted.  Doubt PE given her symptoms, it is on the differential however patient is not tachycardic, not hypoxic and complaining of no shortness of breath currently.  Doubt ACS, nonischemic EKG with no signs of acute ischemia, troponin normal, chest x-ray reassuring as well, no signs of pneumothorax, hemothorax or lingering pneumonia.  Likely her symptoms are pleurisy related, secondary to her recent Covid infection.  Instructed patient to take ibuprofen and Tylenol for pain, to return to the emergency department for any worsening symptoms.  She agreed understood plan, discharged home in good condition.  The attending physician was present and available for all medical decision making and procedures related to this patient's care.  Final Clinical Impression(s) / ED Diagnoses Final diagnoses:  SOB (shortness of breath)    Rx / DC Orders ED Discharge Orders    None       Kizzie Fantasia, MD 01/30/19 Patrecia Pour    Elnora Morrison, MD 01/30/19 2302

## 2019-03-05 ENCOUNTER — Ambulatory Visit: Payer: 59

## 2020-05-08 ENCOUNTER — Ambulatory Visit: Payer: 59 | Admitting: Sports Medicine

## 2020-05-08 ENCOUNTER — Other Ambulatory Visit: Payer: Self-pay

## 2020-05-08 ENCOUNTER — Ambulatory Visit
Admission: RE | Admit: 2020-05-08 | Discharge: 2020-05-08 | Disposition: A | Discharge status: Home / Self Care | Payer: 59 | Source: Ambulatory Visit | Attending: Sports Medicine | Admitting: Sports Medicine

## 2020-05-08 VITALS — BP 118/72 | Ht 65.0 in | Wt 114.0 lb

## 2020-05-08 DIAGNOSIS — M25552 Pain in left hip: Secondary | ICD-10-CM

## 2020-05-09 ENCOUNTER — Encounter: Payer: Self-pay | Admitting: Sports Medicine

## 2020-05-09 NOTE — Progress Notes (Signed)
   Subjective:    Patient ID: Colleen Gillespie, female    DOB: Jul 28, 1980, 40 y.o.   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  She remembers having pain after an 8 mile run that has persisted.  She has run several half marathons and was training for a 10K race at that time.  Since then, she has had pretty persistent groin pain with running.  She has taken some time off which did improve her symptoms.  However, when she returned to running her pain returned.  She localizes it all to the left groin.  She denies limping currently although she admits that she has had to limp in the past when her pain is severe.  Ice appears to be helpful.  She denies any similar injuries in the past.  She is not running currently.  Past medical history reviewed Medications reviewed.  Medications include calcium and vitamin D Allergies reviewed    Review of Systems As above    Objective:   Physical Exam  Well-developed, fit appearing.  No acute distress.  Awake alert and oriented x3.  Vital signs reviewed  Left hip: Smooth painless hip range of motion with a negative logroll.  Negative FADIR.  No pain with resisted hip flexion.  No tenderness to palpation.  She does have a positive hop test.  Neurovascularly intact distally.  Walking without a limp.  X-rays of the left hip including an AP pelvis and lateral left hip show no obvious femoral neck stress fracture and no significant degenerative changes.      Assessment & Plan:   Chronic left hip pain worrisome for femoral neck stress fracture versus stress reaction  I recommended an MRI of the left hip specifically to rule out a femoral neck stress fracture.  Phone follow-up with those results when available.  Since she is not having significant pain with walking and is currently not limping, going to wait on making her  nonweightbearing with crutches.  Obviously I may reconsider this once I review her MRI.  I did go ahead and provide her with a copy of the up-to-date return to running protocol for this injury but she is instructed to not yet begin this.

## 2020-05-19 ENCOUNTER — Ambulatory Visit
Admission: RE | Admit: 2020-05-19 | Discharge: 2020-05-19 | Disposition: A | Discharge status: Home / Self Care | Payer: 59 | Source: Ambulatory Visit | Attending: Sports Medicine | Admitting: Sports Medicine

## 2020-05-19 ENCOUNTER — Other Ambulatory Visit: Payer: Self-pay

## 2020-05-19 DIAGNOSIS — M25552 Pain in left hip: Secondary | ICD-10-CM

## 2020-05-24 ENCOUNTER — Telehealth: Payer: Self-pay | Admitting: Sports Medicine

## 2020-05-24 NOTE — Telephone Encounter (Signed)
  I spoke with Colleen Gillespie on the phone today after reviewing the MRI of her left hip.  MRI does confirm a femoral neck stress reaction without definitive evidence of a stress fracture.  She tells me that her hip is a little more sore today due to the fact that she is having to prepare for field day at her school.  She also says that up until today she has not had any real pain with walking.  I have encouraged her to keep her follow-up appointment with me early next week to discuss treatment going forward.  I may need to put her on crutches for a couple of weeks if her hip pain persists through the weekend.

## 2020-05-29 ENCOUNTER — Other Ambulatory Visit: Payer: Self-pay

## 2020-05-29 ENCOUNTER — Ambulatory Visit: Payer: 59 | Admitting: Sports Medicine

## 2020-05-29 ENCOUNTER — Encounter: Payer: Self-pay | Admitting: Sports Medicine

## 2020-05-29 VITALS — BP 98/60 | Ht 65.0 in | Wt 113.0 lb

## 2020-05-29 DIAGNOSIS — M25552 Pain in left hip: Secondary | ICD-10-CM | POA: Diagnosis not present

## 2020-05-29 NOTE — Progress Notes (Signed)
Patient ID: Colleen Gillespie, female   DOB: September 10, 1980, 40 y.o.   MRN: [id number]  [name] presents today to further discuss the MRI findings of her left hip.  She has mild subcortical bone marrow edema in the medial aspect of the left femoral neck suggestive of a stress reaction here.  No stress fracture is seen.  Her pain has improved over the past several days.  She denies walking with a limp.  She has been exercising on her bike indoors on a trainer without pain.  Physical exam was not repeated.  We simply talked about treatment going forward.  This is a mild stress reaction of her hip and I anticipate she will do well.  I provided her with a copy of the up-to-date return to running protocol to use as a guide when increasing her activity.  She will likely accelerate the protocol since she does not have a complete stress fracture.  She understands that if she has pain at any point during the progression then she needs to slow down and resume the previous phase.  We decided to leave follow-up open-ended but she will return to the office if she does not continue to improve as anticipated or if she would like for [place] to perform a gait analysis when she returns to running.

## 2020-09-12 ENCOUNTER — Ambulatory Visit: Payer: 59 | Admitting: Family Medicine

## 2021-09-03 ENCOUNTER — Encounter: Payer: Self-pay | Admitting: Gastroenterology

## 2021-09-05 ENCOUNTER — Encounter: Payer: Self-pay | Admitting: Gastroenterology

## 2021-09-05 ENCOUNTER — Ambulatory Visit: Payer: 59 | Admitting: Gastroenterology

## 2021-09-05 VITALS — BP 102/66 | HR 86 | Ht 65.0 in | Wt 117.8 lb

## 2021-09-05 DIAGNOSIS — K625 Hemorrhage of anus and rectum: Secondary | ICD-10-CM

## 2021-09-05 DIAGNOSIS — R1909 Other intra-abdominal and pelvic swelling, mass and lump: Secondary | ICD-10-CM

## 2021-09-05 MED ORDER — NA SULFATE-K SULFATE-MG SULF 17.5-3.13-1.6 GM/177ML PO SOLN: 1.0000 | Freq: Once | ORAL | 0 refills | Status: AC

## 2021-09-05 NOTE — Patient Instructions (Signed)
Your provider has requested that you go to the basement level for lab work before leaving today. Press "B" on the elevator. The lab is located at the first door on the left as you exit the elevator.  You have been scheduled for an pelvic ultrasound at [facility] (1st floor of hospital) on 09/12/21 at 8:30am. Please arrive 30 minutes prior to your appointment for registration. Make certain to drink at least 32 oz of water 1 hour prior to appt and have a full bladder. Should you need to reschedule your appointment, please contact radiology at [phone]. This test typically takes about 30 minutes to perform.  You have been scheduled for a colonoscopy. Please follow written instructions given to you at your visit today.  Please pick up your prep supplies at the pharmacy within the next 1-3 days. If you use inhalers (even only as needed), please bring them with you on the day of your procedure.  The [facility] providers would like to encourage you to use [facility] to communicate with providers for non-urgent requests or questions.  Due to long hold times on the telephone, sending your provider a message by [facility] may be a faster and more efficient way to get a response.  Please allow 48 business hours for a response.  Please remember that this is for non-urgent requests.   Due to recent changes in healthcare laws, you may see the results of your imaging and laboratory studies on MyChart before your provider has had a chance to review them.  We understand that in some cases there may be results that are confusing or concerning to you. Not all laboratory results come back in the same time frame and the provider may be waiting for multiple results in order to interpret others.  Please give [place] 48 hours in order for your provider to thoroughly review all the results before contacting the office for clarification of your results.

## 2021-09-05 NOTE — Progress Notes (Signed)
HPI :  41 year old female history of renal stones, otherwise very healthy, referred by Addison Naegeli NP for lump in left inguinal area, also with history of rectal bleeding.  This is her first time to our office.  She reports about 4 weeks ago she had nonspecific pain in her right mid abdomen to the right of her umbilicus that was intermittent.  It lasted less than 24 hours and resolved on its own.  There was no other associated symptoms at that time.  About a week after that happened, roughly 3 weeks ago, she noticed a "lump" in her left lower area.  By this she means in her pelvic/inguinal area.  She was seen by her gynecologist and was told she had a normal pelvic exam.  They also performed an ultrasound of her pelvis which was reportedly normal.  She states its about the width of her thumb, its not painful overall.  She notices it more when she is standing and bears down.  It does not cause her any pain.  She has not had any pain otherwise.  She is very active, runs anywhere from 4 to 9 miles roughly 4 days/week, has been doing this for a long time.  She also does abdominal/core exercises routinely.  She denies any abdominal trauma or pain in her abdomen with this recently.  She denies any weight loss.  No fevers.  Her bowel habits are regular.  She denies any problems with her stool frequency however has been noticing blood in her stool in recent months.  She states usually this will be seen after she has a long run.  She states its not just blood on the toilet paper but in the stool and in the toilet itself.  She seen this 2-3 times in the past 3 or 4 weeks, has had this ongoing for a few months.  No family history of colon cancer.  She has never had a prior colonoscopy.   Past Medical History:  Diagnosis Date   Hx of varicella    Kidney stones      Past Surgical History:  Procedure Laterality Date   NO PAST SURGERIES     Family History  Problem Relation Age of Onset   Anxiety  disorder Mother    Hypertension Father    Cancer Father        skin   Heart disease Maternal Grandmother    Diabetes Maternal Grandfather    Colon cancer Neg Hx    Stomach cancer Neg Hx    Esophageal cancer Neg Hx    Colon polyps Neg Hx    Social History   Tobacco Use   Smoking status: Never   Smokeless tobacco: Never  Substance Use Topics   Alcohol use: No    Comment: socially   Drug use: No   Current Outpatient Medications  Medication Sig Dispense Refill   calcium carbonate (TUMS - DOSED IN MG ELEMENTAL CALCIUM) 500 MG chewable tablet Chew 1 tablet by mouth 2 (two) times daily as needed for indigestion or heartburn.     CALCIUM-VITAMIN D PO Take 1 tablet by mouth daily.     Multiple Vitamins-Minerals (ONE-A-DAY WOMENS PO) Take by mouth.     Prenatal Vit-Fe Fumarate-FA (PRENATAL MULTIVITAMIN) TABS tablet Take 1 tablet by mouth daily at 12 noon. (Patient not taking: Reported on 09/05/2021)     No current facility-administered medications for this visit.   Allergies  Allergen Reactions   Aspirin Anaphylaxis  Review of Systems: All systems reviewed and negative except where noted in HPI.   No labs on file  Physical Exam: BP 102/66   Pulse 86   Ht 5\' 5"  (1.651 m)   Wt 117 lb 12.8 oz (53.4 kg)   BMI 19.60 kg/m  Constitutional: Pleasant,well-developed, female in no acute distress. HEENT: Normocephalic and atraumatic. Conjunctivae are normal. No scleral icterus. Neck supple.  Cardiovascular: Normal rate, regular rhythm.  Pulmonary/chest: Effort normal and breath sounds normal. No wheezing, rales or rhonchi. Abdominal: Soft, nondistended, nontender. There are no masses palpable. No hepatomegaly. DRE / Anoscopy - no anal fissure, small hemorrhoids seen, no polyp or mass lesion. CMA Amanda in room as standby Left inguinal area - soft, protuberant area slightly larger than a quarter, not painful. Enlarges with valsalva and with standing. No abnormal inguinal  lymphadenopathy which is noted.  Extremities: no edema Neurological: Alert and oriented to person place and time. Skin: Skin is warm and dry. No rashes noted. Psychiatric: Normal mood and affect. Behavior is normal.   ASSESSMENT: 42 y.o. female here for assessment of the following  1. Lump in the groin   2. Rectal bleeding    Regarding the patient's exam findings in the left inguinal area, she may have a small inguinal hernia causing this finding versus lipoma or cystic lesion.  I do not appreciate any significant lymphadenopathy in the area.  Otherwise this is painless and not bothering her otherwise but is definitely new in recent weeks.  Work-up per Guynn with pelvic exam and pelvic ultrasound was negative.  Reassured her on the findings but some type of imaging may be useful to clarify findings.  I offered her an ultrasound dedicated to this area observe for obvious hernia or lipoma in the area.  We will start with that.  If it is negative we may consider a CT scan of her pelvis.  Could be related to her abdominal exercises, she has backed off those recently.  We will await imaging with further recommendations.  Intermittent rectal bleeding for several months, seems to be worse with exercise.  She has very small hemorrhoids noted on anoscopy but otherwise no concerning pathology otherwise.  Hopefully this is benign anorectal bleeding however given persistence over months I think colonoscopy is reasonable to clarify source and ensure no polypoid/mass lesion causing this issue.  I discussed colonoscopy, risk benefits the exam and anesthesia with her and she wants to proceed.  Further recommendations pending results.   PLAN: - localized 45 of left inguinal area to assess for hernia vs other - lab for CBC and TIBC / ferritin in light of ongoing rectal bleeding - Colonoscopy to be scheduled at the [facility]   [facility], MD [facility]  CC: Harlin Rain, NP

## 2021-09-06 ENCOUNTER — Ambulatory Visit (HOSPITAL_COMMUNITY): Payer: 59

## 2021-09-10 ENCOUNTER — Encounter: Payer: Self-pay | Admitting: Gastroenterology

## 2021-09-10 ENCOUNTER — Ambulatory Visit (AMBULATORY_SURGERY_CENTER): Payer: 59 | Admitting: Gastroenterology

## 2021-09-10 VITALS — BP 96/56 | HR 50 | Temp 98.0°F | Resp 12 | Ht 65.0 in | Wt 117.0 lb

## 2021-09-10 DIAGNOSIS — K649 Unspecified hemorrhoids: Secondary | ICD-10-CM | POA: Diagnosis not present

## 2021-09-10 DIAGNOSIS — K625 Hemorrhage of anus and rectum: Secondary | ICD-10-CM

## 2021-09-10 MED ORDER — SODIUM CHLORIDE 0.9 % IV SOLN: 500.0000 mL | INTRAVENOUS | Status: DC

## 2021-09-10 NOTE — Patient Instructions (Signed)
    HANDOUT ON HEMORRHOIDS GIVEN TO YOU TODAY    FOLLOW UP IN OFFICE IF BLEEDING PERSISTS   YOU HAD AN ENDOSCOPIC PROCEDURE TODAY AT THE [facility]:   Refer to the procedure report that was given to you for any specific questions about what was found during the examination.  If the procedure report does not answer your questions, please call your gastroenterologist to clarify.  If you requested that your care partner not be given the details of your procedure findings, then the procedure report has been included in a sealed envelope for you to review at your convenience later.  YOU SHOULD EXPECT: Some feelings of bloating in the abdomen. Passage of more gas than usual.  Walking can help get rid of the air that was put into your GI tract during the procedure and reduce the bloating. If you had a lower endoscopy (such as a colonoscopy or flexible sigmoidoscopy) you may notice spotting of blood in your stool or on the toilet paper. If you underwent a bowel prep for your procedure, you may not have a normal bowel movement for a few days.  Please Note:  You might notice some irritation and congestion in your nose or some drainage.  This is from the oxygen used during your procedure.  There is no need for concern and it should clear up in a day or so.  SYMPTOMS TO REPORT IMMEDIATELY:  Following lower endoscopy (colonoscopy or flexible sigmoidoscopy):  Excessive amounts of blood in the stool  Significant tenderness or worsening of abdominal pains  Swelling of the abdomen that is new, acute  Fever of 100F or higher   For urgent or emergent issues, a gastroenterologist can be reached at any hour by calling [phone]. Do not use MyChart messaging for urgent concerns.    DIET:  We do recommend a small meal at first, but then you may proceed to your regular diet.  Drink plenty of fluids but you should avoid alcoholic beverages for 24 hours.  ACTIVITY:  You should plan to take it  easy for the rest of today and you should NOT DRIVE or use heavy machinery until tomorrow (because of the sedation medicines used during the test).    FOLLOW UP: Our staff will call the number listed on your records the next business day following your procedure.  We will call around 7:15- 8:00 am to check on you and address any questions or concerns that you may have regarding the information given to you following your procedure. If we do not reach you, we will leave a message.  If you develop any symptoms (ie: fever, flu-like symptoms, shortness of breath, cough etc.) before then, please call [phone].  If you test positive for Covid 19 in the 2 weeks post procedure, please call and report this information to [place].    If any biopsies were taken you will be contacted by phone or by letter within the next 1-3 weeks.  Please call us at [phone] if you have not heard about the biopsies in 3 weeks.    SIGNATURES/CONFIDENTIALITY: You and/or your care partner have signed paperwork which will be entered into your electronic medical record.  These signatures attest to the fact that that the information above on your After Visit Summary has been reviewed and is understood.  Full responsibility of the confidentiality of this discharge information lies with you and/or your care-partner.

## 2021-09-10 NOTE — Progress Notes (Signed)
Report given to PACU, vss 

## 2021-09-10 NOTE — Progress Notes (Signed)
0945 Ephedrine 10 mg given IV due to low BP, MD updated.  

## 2021-09-10 NOTE — Progress Notes (Signed)
History and Physical Interval Note: Patient seen on 8/31. No interval changes. Colonoscopy done to evaluate intermittent symptoms of rectal bleeding.   09/10/2021 9:16 AM  Colleen Gillespie  has presented today for endoscopic procedure(s), with the diagnosis of  Encounter Diagnosis  Name Primary?   Rectal bleeding Yes  .  The various methods of evaluation and treatment have been discussed with the patient and/or family. After consideration of risks, benefits and other options for treatment, the patient has consented to  the endoscopic procedure(s).   The patient's history has been reviewed, patient examined, no change in status, stable for surgery.  I have reviewed the patient's chart and labs.  Questions were answered to the patient's satisfaction.    Harlin Rain, MD [facility]

## 2021-09-10 NOTE — Op Note (Signed)
[place] Patient Name: Colleen Gillespie Procedure Date: 09/10/2021 9:16 AM MRN: [id number] Endoscopist: Remo Lipps P. Havery Moros , MD Age: 41 Referring MD:  Date of Birth: 16-Dec-1980 Gender: Female Account #: [id number] Procedure:                Colonoscopy Indications:              Rectal bleeding - intermittent small volume Medicines:                Monitored Anesthesia Care Procedure:                Pre-Anesthesia Assessment:                           - Prior to the procedure, a History and Physical                            was performed, and patient medications and                            allergies were reviewed. The patient's tolerance of                            previous anesthesia was also reviewed. The risks                            and benefits of the procedure and the sedation                            options and risks were discussed with the patient.                            All questions were answered, and informed consent                            was obtained. Prior Anticoagulants: The patient has                            taken no previous anticoagulant or antiplatelet                            agents. ASA Grade Assessment: II - A patient with                            mild systemic disease. After reviewing the risks                            and benefits, the patient was deemed in                            satisfactory condition to undergo the procedure.                           After obtaining informed consent, the colonoscope  was passed under direct vision. Throughout the                            procedure, the patient's blood pressure, pulse, and                            oxygen saturations were monitored continuously. The                            Olympus PCF-H190DL [id number]) Colonoscope was                            introduced through the anus and advanced to the the                            terminal  ileum, with identification of the                            appendiceal orifice and IC valve. The colonoscopy                            was performed without difficulty. The patient                            tolerated the procedure well. The quality of the                            bowel preparation was good. The terminal ileum,                            ileocecal valve, appendiceal orifice, and rectum                            were photographed. Scope In: 9:22:22 AM Scope Out: 9:40:04 AM Scope Withdrawal Time: 0 hours 13 minutes 48 seconds  Total Procedure Duration: 0 hours 17 minutes 42 seconds  Findings:                 The perianal and digital rectal examinations were                            normal.                           The terminal ileum appeared normal.                           Internal hemorrhoids were found during                            retroflexion. The hemorrhoids were small.                           The exam was otherwise without abnormality. Complications:            No immediate complications. Estimated blood loss:  Minimal. Estimated Blood Loss:     Estimated blood loss was minimal. Impression:               - The examined portion of the ileum was normal.                           - Internal hemorrhoids.                           - The examination was otherwise normal.                           No polyps or mass lesions. Suspect intermittent                            hemorrhoidal bleeding is the cause of symptoms Recommendation:           - Patient has a contact number available for                            emergencies. The signs and symptoms of potential                            delayed complications were discussed with the                            patient. Return to normal activities tomorrow.                            Written discharge instructions were provided to the                            patient.                            - Resume previous diet.                           - Continue present medications.                           - If symptoms bothersome / frequent - daily fiber                            suppement such as Citrucel, can also use Calmol4                            suppositories OTC as needed                           - Follow up in the office as needed if bleeding                            symptoms persist                           -  Repeat colonoscopy in 10 years for screening                            purposes. Remo Lipps P. Blanka Rockholt, MD 09/10/2021 9:44:43 AM This report has been signed electronically.

## 2021-09-11 ENCOUNTER — Telehealth: Payer: Self-pay

## 2021-09-11 NOTE — Telephone Encounter (Signed)
  Follow up Call-     09/10/2021    8:37 AM  Call back number  Post procedure Call Back phone  # [phone]  Permission to leave phone message Yes     Patient questions:  Do you have a fever, pain , or abdominal swelling? No. Pain Score  0 *  Have you tolerated food without any problems? Yes.    Have you been able to return to your normal activities? Yes.    Do you have any questions about your discharge instructions: Diet   No. Medications  No. Follow up visit  No.  Do you have questions or concerns about your Care? No.  Actions: * If pain score is 4 or above: No action needed, pain <4.

## 2021-09-12 ENCOUNTER — Ambulatory Visit (HOSPITAL_COMMUNITY)
Admission: RE | Admit: 2021-09-12 | Discharge: 2021-09-12 | Disposition: A | Discharge status: Home / Self Care | Payer: 59 | Source: Ambulatory Visit | Attending: Gastroenterology | Admitting: Gastroenterology

## 2021-09-12 DIAGNOSIS — R1909 Other intra-abdominal and pelvic swelling, mass and lump: Secondary | ICD-10-CM | POA: Insufficient documentation

## 2023-03-20 ENCOUNTER — Other Ambulatory Visit (HOSPITAL_BASED_OUTPATIENT_CLINIC_OR_DEPARTMENT_OTHER): Payer: Self-pay | Admitting: Family Medicine

## 2023-03-20 DIAGNOSIS — M84459S Pathological fracture, hip, unspecified, sequela: Secondary | ICD-10-CM

## 2023-03-31 ENCOUNTER — Ambulatory Visit (HOSPITAL_BASED_OUTPATIENT_CLINIC_OR_DEPARTMENT_OTHER)
Admission: RE | Admit: 2023-03-31 | Discharge: 2023-03-31 | Disposition: A | Discharge status: Home / Self Care | Source: Ambulatory Visit | Attending: Family Medicine | Admitting: Family Medicine

## 2023-03-31 DIAGNOSIS — M84459S Pathological fracture, hip, unspecified, sequela: Secondary | ICD-10-CM | POA: Diagnosis present
